# Patient Record
Sex: Male | Born: 1995 | Race: White | Hispanic: No | Marital: Single | Smoking: Former smoker
Health system: Southern US, Community
[De-identification: ages and names within clinical notes are randomized; demographics above are authoritative.]

---
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
## 2016-05-01 IMAGING — CR DG HAND COMPLETE 3+V*R*
3 series · 3 of 3 positions shown · non-contrast
Comparison: None.

CLINICAL DATA: Fall yesterday with right hand pain, initial
encounter

EXAM:
RIGHT HAND - COMPLETE 3+ VIEW

[PA]
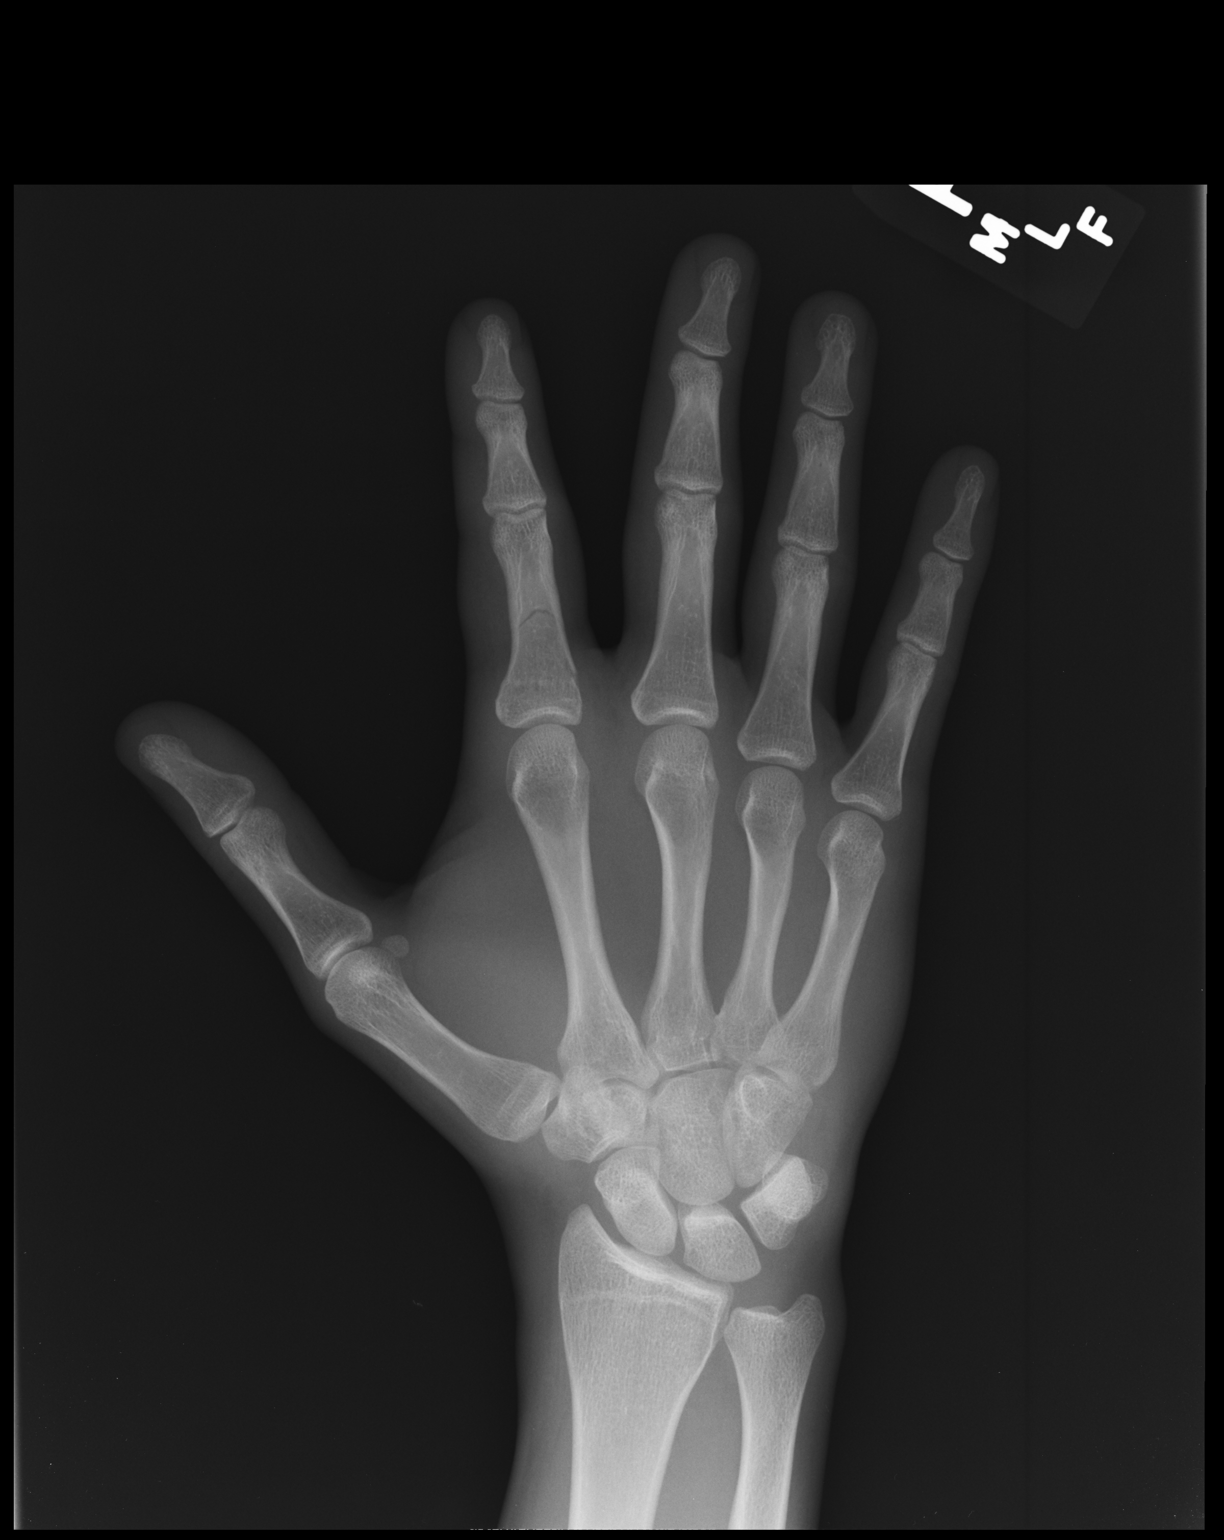

[lateral]
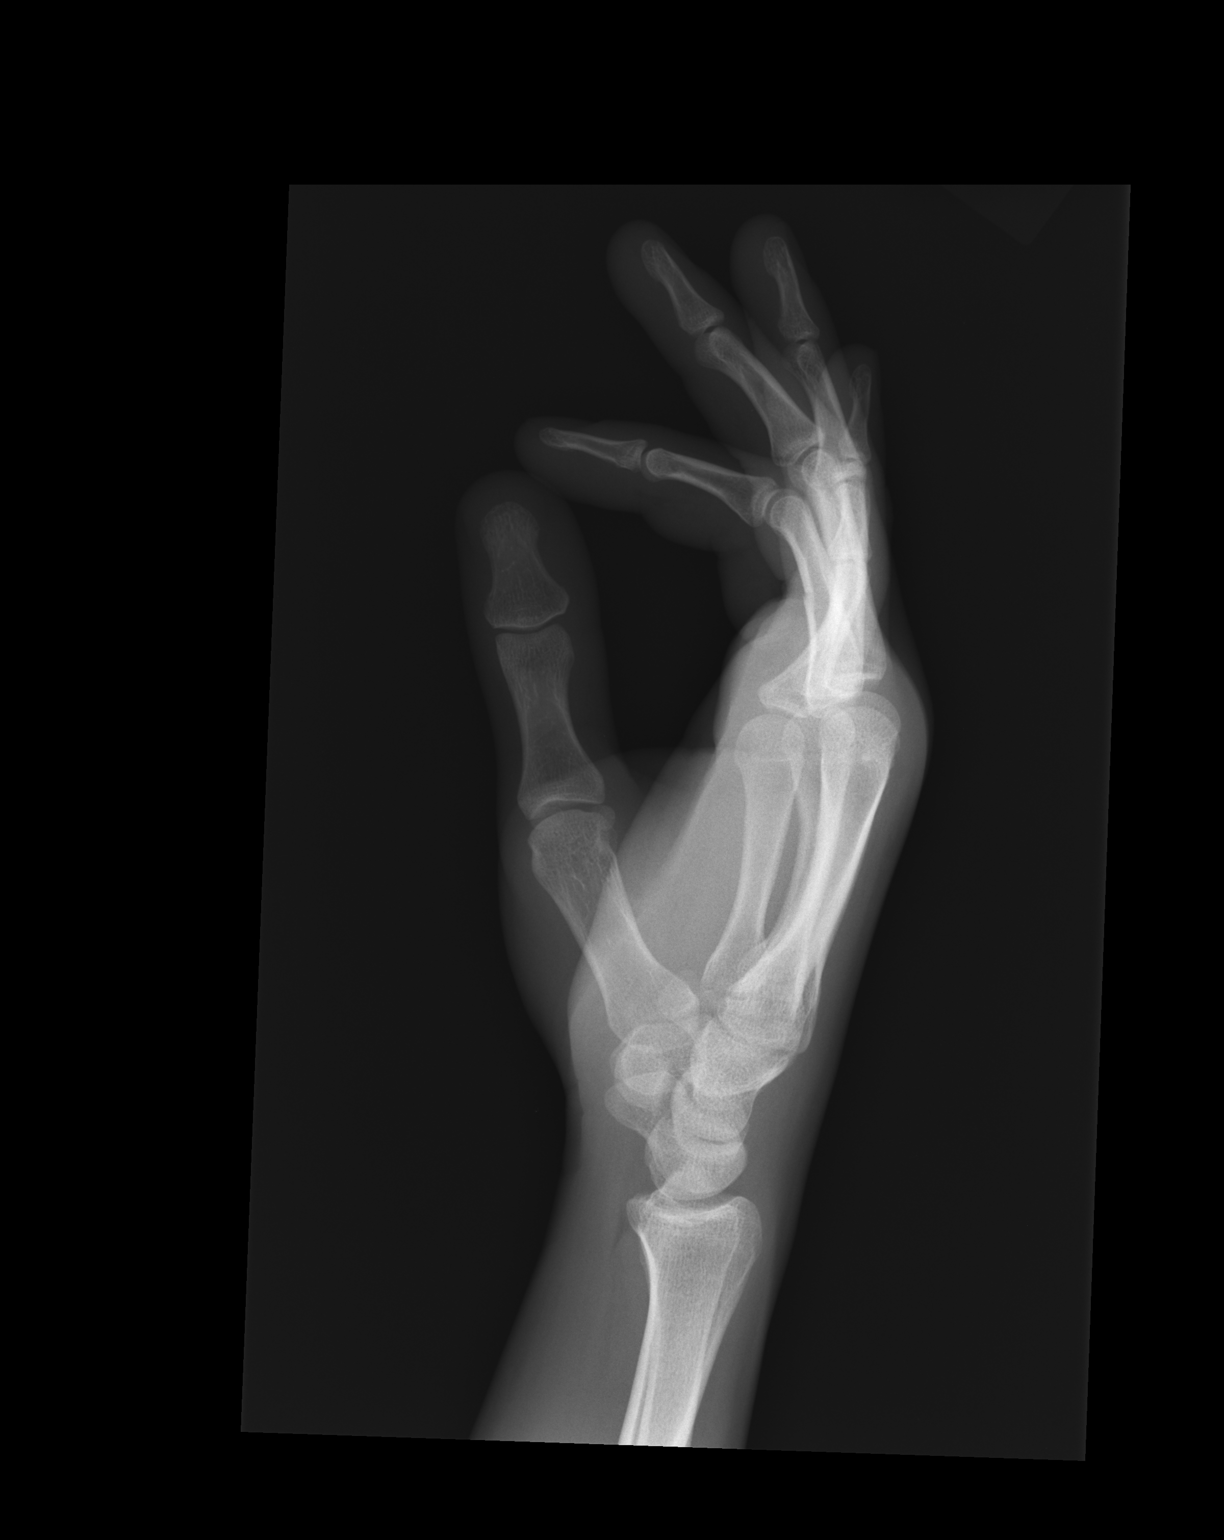

[pa obl]
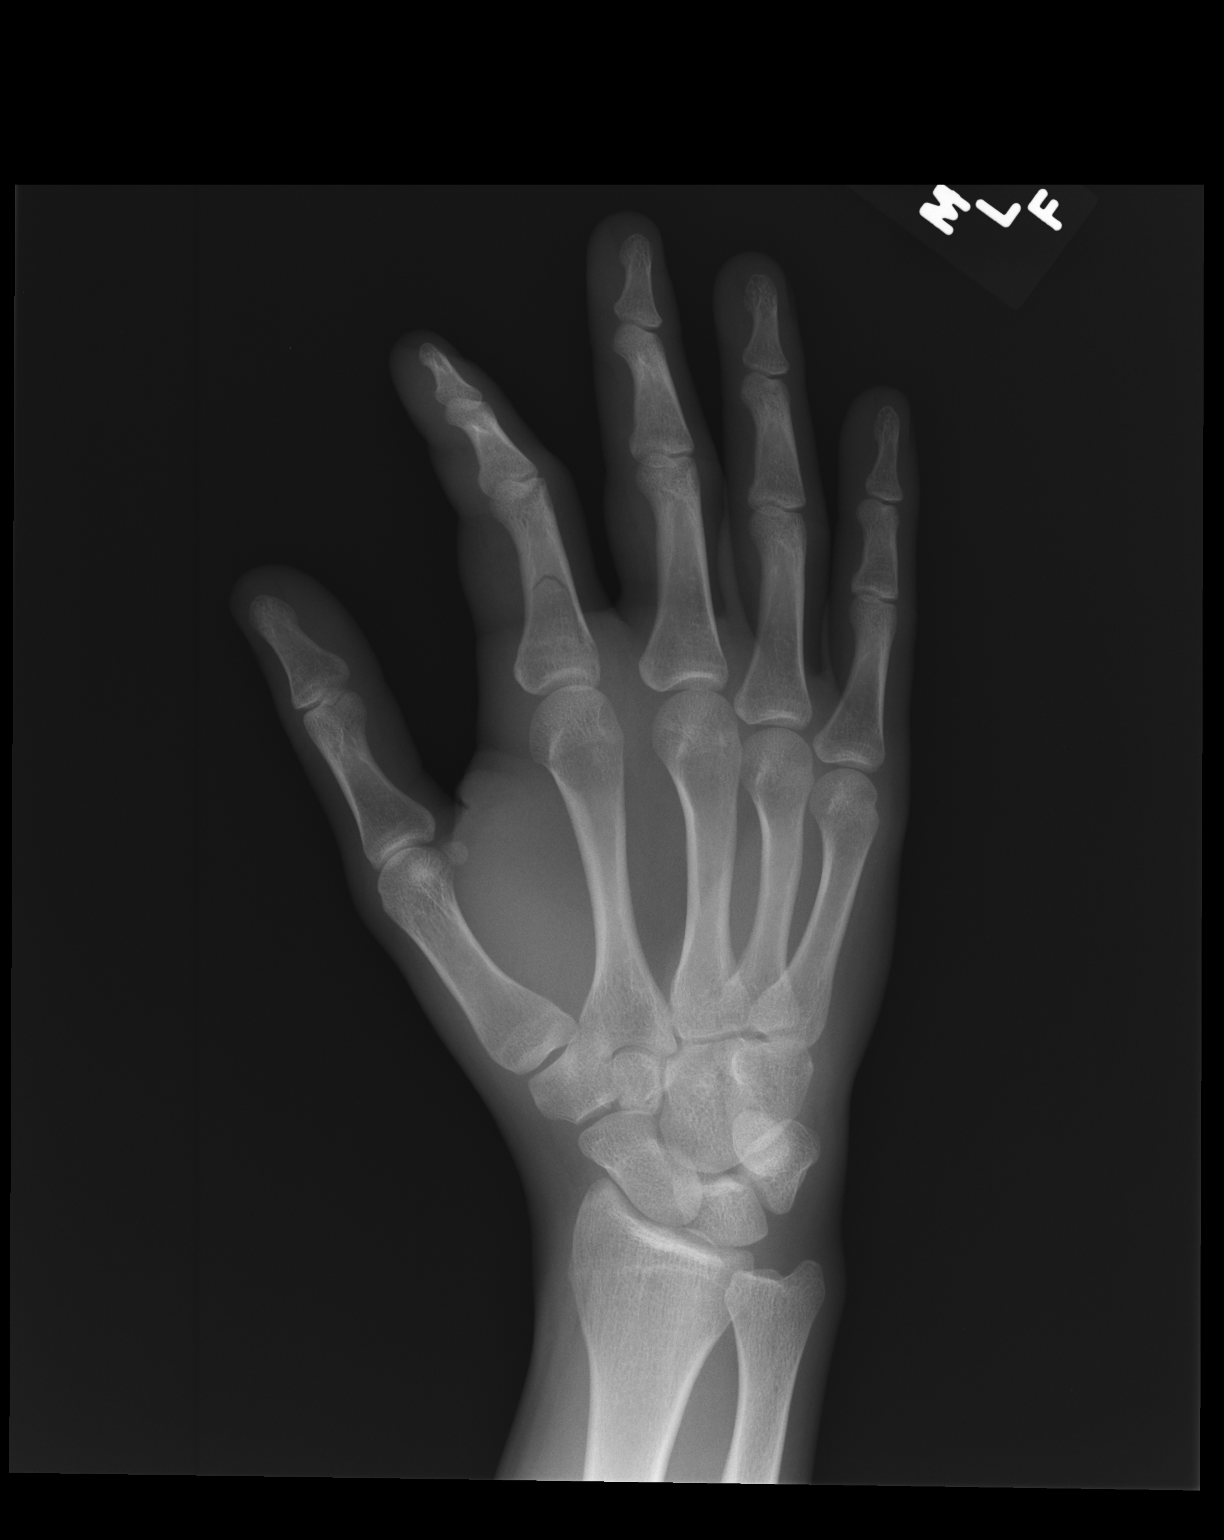

[3 of 3 positions shown; findings below may reference images not displayed]

FINDINGS: An undisplaced fracture of the second proximal phalanx is
identified. No significant angulation is seen. No other fractures
are noted.
IMPRESSION: Second proximal phalangeal fracture.

## 2016-05-18 ENCOUNTER — Ambulatory Visit (INDEPENDENT_AMBULATORY_CARE_PROVIDER_SITE_OTHER): Payer: Medicaid Other | Admitting: Student

## 2016-05-18 ENCOUNTER — Encounter: Payer: Self-pay | Admitting: Student

## 2016-05-18 VITALS — BP 106/64 | HR 63 | Temp 98.3°F | Ht 72.0 in | Wt 141.0 lb

## 2016-05-18 DIAGNOSIS — Z114 Encounter for screening for human immunodeficiency virus [HIV]: Secondary | ICD-10-CM

## 2016-05-18 DIAGNOSIS — Z8659 Personal history of other mental and behavioral disorders: Secondary | ICD-10-CM

## 2016-05-18 DIAGNOSIS — F0630 Mood disorder due to known physiological condition, unspecified: Secondary | ICD-10-CM | POA: Diagnosis not present

## 2016-05-18 DIAGNOSIS — Z0000 Encounter for general adult medical examination without abnormal findings: Secondary | ICD-10-CM | POA: Insufficient documentation

## 2016-05-18 NOTE — Assessment & Plan Note (Signed)
PHQ-9 11 with significant difficulty. Also reports some burst of energy occasionally which concerns me for underlying mania/hypomania. He also smokes THC and cigarette which makes it even challenging. So advised him to stop smoking marijuana and cigarette. Despite all these, he is working toward his goal. He has been accepted to collage. Will evaluate him again when he returns. Will be very cautious about SSRI on him

## 2016-05-18 NOTE — Patient Instructions (Signed)
It was great seeing you today! We have addressed the following issues today  1. Staying healthy: I strongly recommend you quit smoking cigarettes and marijuana 2. ADHD: will discuss about this when I receive your medical record from your previous provider    If we did any lab work today, and the results require attention, either me or my nurse will get in touch with you. If everything is normal, you will get a letter in mail. If you don't hear from us in two weeks, please give us a call. Otherwise, I look forward to talking with you again at our next visit. If you have any questions or concerns before then, please call the clinic at [phone].  Please bring all your medications to every doctors visit   Sign up for My Chart to have easy access to your labs results, and communication with your Primary care physician.    Please check-out at the front desk before leaving the clinic.   Take Care,

## 2016-05-18 NOTE — Assessment & Plan Note (Addendum)
Strongly advised him to quit smoking cigarette and THC. He agrees and understands the effect of this on his health. Will assess and remind him again when he returns in two weeks.  Will check HIV

## 2016-05-18 NOTE — Assessment & Plan Note (Signed)
Has been off medications for the last 4 years. Reports going to [place]. However, I couldn't not see any of his medical history in Epic. He hasn't seen doctor for over three years now. Signed release form today. Will address about this when I receive his medical records, probably in weeks to month.

## 2016-05-18 NOTE — Progress Notes (Signed)
Subjective:    Patient ID: Jack Nguyen, male    DOB: 15-Feb-1996, 20 y.o.   MRN: [id number]  CC: Establish care  HPI here to establish care. Has concern about the following issues:  -ADHD: off meds for 4 years due to side effects. Reports trying adderal, Vyvanse and some other medications in the past.   -Migraine headache: Reports occasional headache. Headache resolves with OTC meds (advil, tylenol). He has no headache now  -Depression: never on any meds. PHQ-9: 11. Endorses occasional depressed mood, some anhedonia, difficulty with concentration and guilt about not getting  A's like his sisters. Denies problem with sleep, appetite, fatigue. Denies  suicidal and homicidal ideation. Reports some brust of energy occasionally. Endorses smoking cigarettes and marijuana occasionally. Denies cocaine or other drugs.  Social:  -School: goes to [employer]. Graduating this year. Planning to go [employer] and study Business. He has been accepted.  -Work: Helps on golf course -Smoking: one cig a day. Planning to stop before it becomes. He says his girl friend and her family smoke. He says "I would like to stop now". Confidence level 6. "I don't have much issue" when asked why he is confident about this. He says THC is always there because of friends (peer pressure) -Alcohol use: denies -Drug: smokes THC almost every day. Can not explain why he is smoking.  Denies other drugs. Says he hang out with friends. Reports some peer pressure -Exercise: lift weight. Twice a week for the last two weeks. He has been going to gym more often before that -Sexual: active. One sexual partner in the last 6 months. No protection.   Review of Systems  Constitutional: Negative for fever and fatigue.  Eyes: Negative for photophobia and visual disturbance.  Respiratory: Negative for cough, chest tightness and shortness of breath.   Cardiovascular: Negative for chest pain, palpitations and leg  swelling.  Gastrointestinal: Negative for blood in stool.  Endocrine: Negative for cold intolerance and heat intolerance.  Genitourinary: Negative for dysuria, discharge, scrotal swelling, genital sores and testicular pain.  Musculoskeletal: Negative for joint swelling.  Skin: Negative for rash.  Neurological: Negative for headaches.  Hematological: Negative for adenopathy.  Psychiatric/Behavioral: Negative for suicidal ideas, hallucinations, behavioral problems, sleep disturbance and self-injury.   Objective:   Physical Exam Filed Vitals:   05/18/16 1544  BP: 106/64  Pulse: 63  Temp: 98.3 F (36.8 C)  TempSrc: Oral  Height: 6' (1.829 m)  Weight: 141 lb (63.957 kg)   GEN: pleasant, appears well, NAD HEENT: [place]/NT, PERRL, EOMI, TM normal, MMM CVS: RRR, normal s1 and s2, no murmurs, no edema RESP: no increased WOB, good air movement bilaterally, CTAB GI: +BS, soft, non-tender,non-distended,  GU: denies scrotal swelling or penile discharge or lesion MSK:no joint pain, swelling or tenderness NEURO: alert and oriented apporopriately, no gross defecits  PSYCH: appropriate mood and affect     Assessment & Plan:  Routine health maintenance Strongly advised him to quit smoking cigarette and THC. He agrees and understands the effect of this on his health. Will assess and remind him again when he returns in two weeks.  Will check HIV  History of ADHD Has been off medications for the last 4 years. Reports going to [place]. However, I couldn't not see any of his medical history in Epic. He hasn't seen doctor for over three years now. Signed release form today. Will address about this when I receive his medical records, probably in weeks to month.  Mood disorder in conditions classified elsewhere PHQ-9 11 with significant difficulty. Also reports some burst of energy occasionally which concerns me for underlying mania/hypomania. He also smokes THC and cigarette which makes it even  challenging. So advised him to stop smoking marijuana and cigarette. Despite all these, he is working toward his goal. He has been accepted to collage. Will evaluate him again when he returns. Will be very cautious about SSRI on him

## 2016-06-07 ENCOUNTER — Telehealth: Payer: Self-pay

## 2016-06-07 NOTE — Telephone Encounter (Signed)
Mrs. Jack Nguyen called in to see if her son's medical record request had been made. Ms. Jack Nguyen said she would get everything together in the am and have it ready for her to pick up before Jack Nguyen's 1:30pm appointment. I left medical record request form on the Medical Records desk along with a sticky note asking to please call Jack Nguyen when records are ready to be picked up.

## 2016-06-08 ENCOUNTER — Ambulatory Visit (INDEPENDENT_AMBULATORY_CARE_PROVIDER_SITE_OTHER): Payer: Medicaid Other | Admitting: Student

## 2016-06-08 ENCOUNTER — Encounter: Payer: Self-pay | Admitting: Student

## 2016-06-08 VITALS — BP 113/67 | HR 80 | Temp 98.6°F | Ht 72.0 in | Wt 136.0 lb

## 2016-06-08 DIAGNOSIS — F0630 Mood disorder due to known physiological condition, unspecified: Secondary | ICD-10-CM

## 2016-06-08 DIAGNOSIS — Z8659 Personal history of other mental and behavioral disorders: Secondary | ICD-10-CM | POA: Diagnosis not present

## 2016-06-08 DIAGNOSIS — F17200 Nicotine dependence, unspecified, uncomplicated: Secondary | ICD-10-CM

## 2016-06-08 DIAGNOSIS — Z720 Tobacco use: Secondary | ICD-10-CM | POA: Diagnosis present

## 2016-06-08 DIAGNOSIS — R5382 Chronic fatigue, unspecified: Secondary | ICD-10-CM | POA: Diagnosis not present

## 2016-06-08 LAB — CBC WITH DIFFERENTIAL/PLATELET
BASOS PCT: 0 %
Basophils Absolute: 0 cells/uL (ref 0–200)
EOS PCT: 0 %
Eosinophils Absolute: 0 cells/uL — ABNORMAL LOW (ref 15–500)
HCT: 46.8 % (ref 36.0–49.0)
Hemoglobin: 15.8 g/dL (ref 12.0–16.9)
LYMPHS PCT: 25 %
Lymphs Abs: 1875 cells/uL (ref 1200–5200)
MCH: 31.5 pg (ref 25.0–35.0)
MCHC: 33.8 g/dL (ref 31.0–36.0)
MCV: 93.2 fL (ref 78.0–98.0)
MPV: 10 fL (ref 7.5–12.5)
Monocytes Absolute: 600 cells/uL (ref 200–900)
Monocytes Relative: 8 %
NEUTROS ABS: 5025 {cells}/uL (ref 1800–8000)
Neutrophils Relative %: 67 %
PLATELETS: 262 10*3/uL (ref 140–400)
RBC: 5.02 MIL/uL (ref 4.10–5.70)
RDW: 13 % (ref 11.0–15.0)
WBC: 7.5 10*3/uL (ref 4.5–13.0)

## 2016-06-08 LAB — TSH: TSH: 0.71 mIU/L (ref 0.50–4.30)

## 2016-06-08 LAB — VITAMIN B12: Vitamin B-12: 863 pg/mL (ref 200–1100)

## 2016-06-08 MED ORDER — BUPROPION HCL ER (SR) 100 MG PO TB12
100.0000 mg | ORAL_TABLET | Freq: Two times a day (BID) | ORAL | Status: DC
Start: 2016-06-08 — End: 2024-05-22

## 2016-06-08 NOTE — Patient Instructions (Signed)
It was great seeing you today! We have addressed the following issues today  1. Inability to concentrate: I have sent a trial prescription for wellbuterin to your pharmacy. I will also recommend you stop using marijuana. I will order a referral to psychiatrist as well.  2. Smoking: congratulation on quitting smoking. The medication I prescribed you should also help with smoking.     If we did any lab work today, and the results require attention, either me or my nurse will get in touch with you. If everything is normal, you will get a letter in mail. If you don't hear from us in two weeks, please give us a call. Otherwise, I look forward to talking with you again at our next visit. If you have any questions or concerns before then, please call the clinic at [phone].  Please bring all your medications to every doctors visit   Sign up for My Chart to have easy access to your labs results, and communication with your Primary care physician.    Please check-out at the front desk before leaving the clinic.   Take Care,

## 2016-06-08 NOTE — Progress Notes (Signed)
   Subjective:    Patient ID: Jack Nguyen, male    DOB: 09-01-1996, 20 y.o.   MRN: [id number]  CC: Follow up on ?ADHD  HPI patient her for follow up on difficulty concentrating. He feels relieved and better because the school is closed. He says he spends his days relaxing at swimming pool and hanging out with his girlfriend. He occasionally goes to gym as well. He quit smoking. He says hasn't moked for the last 10 days. However, he continues to smoke marijuana with his friends. He says his mood is good. Denies SI or HI. Denies insomnia, fatigue, appetite issue, unintentional and purposeless motions or burst of energy. He says he is not sure about his concentration problem as school is closed and he doesn't have anything to concentrate on. Denies cold or heat intolerance.  Patient's mother report problem with appetite and memory as well. She says he hangout with his girl friend who doesn't eat a lot. Mother says non of the stimulant's worked for him in the past. She says they have not tried Strattera as the medicine was very expensive at that time. She says "it didn't worth to spend $500 on a medicine they are unsure if it helps" They brought paper copies of his medical record from [place] today.   Review of Systems per HPI Objective:   Physical Exam Filed Vitals:   06/08/16 1347  BP: 113/67  Pulse: 80  Temp: 98.6 F (37 C)  TempSrc: Oral  Height: 6' (1.829 m)  Weight: 136 lb (61.689 kg)    GEN: appears well, NAD Neck: supple, no LAD CVS: RRR, normal s1 and s2, no murmurs, no edema RESP: no increased work of breathing, good air movement bilaterally, no crackles or wheeze GI: soft, non-tender,non-distended, +BS NEURO: A&O x3, no gross defecits  PSYCH: mood "good" appropriate affect     Assessment & Plan:  History of ADHD Unclear about this diagnosis. The fact that they have tried multiple stimulant medications and none worked makes it even less likely. He doesn't reports  hyperactivity or impulsivity.  His problem doesn't appear to have significant impact on his social and functional status to my understanding. He continues to maintain good relation with his girl friend and his other friends. He is already accepted to college. Congratulated him on quitting smoking. Others on differential diagnosis is depression also he doesn't meet the criteria. I am concerned about his marijuana use and warned him about the impact of this on his health and his care. I have discussed about trying Bupropion, which could potentially help with mood issue (if there is one), smoking and ?ADHD/inattention. He has no history of seizure. I will check his TSH, Vit B12 level, Vit D level and CBC today. I have also ordered ambulatory psychiatric referral.  I will review his papers from [facility] as well.

## 2016-06-09 ENCOUNTER — Encounter: Payer: Self-pay | Admitting: Student

## 2016-06-09 LAB — VITAMIN D 25 HYDROXY (VIT D DEFICIENCY, FRACTURES): Vit D, 25-Hydroxy: 30 ng/mL (ref 30–100)

## 2016-06-09 NOTE — Assessment & Plan Note (Signed)
Unclear about this diagnosis. The fact that they have tried multiple stimulant medications and none worked makes it even less likely. He doesn't reports hyperactivity or impulsivity.  His problem doesn't appear to have significant impact on his social and functional status to my understanding. He continues to maintain good relation with his girl friend and his other friends. He is already accepted to college. Congratulated him on quitting smoking. Others on differential diagnosis is depression also he doesn't meet the criteria. I am concerned about his marijuana use and warned him about the impact of this on his health and his care. I have discussed about trying Bupropion, which could potentially help with mood issue (if there is one), smoking and ?ADHD/inattention. He has no history of seizure. I will check his TSH, Vit B12 level, Vit D level and CBC today. I have also ordered ambulatory psychiatric referral.  I will review his papers from [place] as well.

## 2016-12-20 ENCOUNTER — Encounter (HOSPITAL_COMMUNITY): Payer: Self-pay | Admitting: Family Medicine

## 2016-12-20 ENCOUNTER — Ambulatory Visit (HOSPITAL_COMMUNITY)
Admission: EM | Admit: 2016-12-20 | Discharge: 2016-12-20 | Disposition: A | Discharge status: Home / Self Care | Payer: Medicaid Other | Attending: Family Medicine | Admitting: Family Medicine

## 2016-12-20 DIAGNOSIS — H9201 Otalgia, right ear: Secondary | ICD-10-CM

## 2016-12-20 MED ORDER — IPRATROPIUM BROMIDE 0.06 % NA SOLN: 2.0000 | Freq: Four times a day (QID) | NASAL | 1 refills | Status: DC

## 2016-12-20 MED ORDER — PREDNISONE 50 MG PO TABS: ORAL_TABLET | ORAL | 0 refills | Status: DC

## 2016-12-20 NOTE — ED Triage Notes (Signed)
Pt here for right ear pain and sinus congestion x 3 weeks.

## 2016-12-20 NOTE — ED Provider Notes (Signed)
[facility]    CSN: [id number] Arrival date & time: 12/20/16  1533     History   Chief Complaint Chief Complaint  Patient presents with  . Otalgia  . Facial Pain    HPI Jack Nguyen is a 20 y.o. male.   The history is provided by the patient.  Otalgia  Location:  Right Behind ear:  No abnormality Quality:  Pressure Severity:  Moderate Onset quality:  Gradual Duration:  3 weeks Progression:  Waxing and waning Chronicity:  New Context: elevation change and recent URI   Associated symptoms: congestion and rhinorrhea   Associated symptoms: no ear discharge and no fever     History reviewed. No pertinent past medical history.  Patient Active Problem List   Diagnosis Date Noted  . History of ADHD 05/18/2016  . Mood disorder in conditions classified elsewhere 05/18/2016  . Routine health maintenance 05/18/2016    History reviewed. No pertinent surgical history.     Home Medications    Prior to Admission medications   Medication Sig Start Date End Date Taking? Authorizing Provider  buPROPion (WELLBUTRIN SR) 100 MG 12 hr tablet Take 1 tablet (100 mg total) by mouth 2 (two) times daily. 06/08/16   Almon Herculesaye T Gonfa, MD    Family History History reviewed. No pertinent family history.  Social History Social History  Substance Use Topics  . Smoking status: Former Smoker    Types: Cigarettes    Quit date: 05/25/2016  . Smokeless tobacco: Never Used  . Alcohol use No     Allergies   Patient has no known allergies.   Review of Systems Review of Systems  Constitutional: Negative for fever.  HENT: Positive for congestion, ear pain and rhinorrhea. Negative for ear discharge.   All other systems reviewed and are negative.    Physical Exam Triage Vital Signs ED Triage Vitals  Enc Vitals Group     BP 12/20/16 1704 115/64     Pulse Rate 12/20/16 1704 85     Resp 12/20/16 1704 18     Temp 12/20/16 1704 97.9 F (36.6 C)     Temp src --    SpO2 12/20/16 1704 98 %     Weight --      Height --      Head Circumference --      Peak Flow --      Pain Score 12/20/16 1705 1     Pain Loc --      Pain Edu? --      Excl. in GC? --    No data found.   Updated Vital Signs BP 115/64   Pulse 85   Temp 97.9 F (36.6 C)   Resp 18   SpO2 98%   Visual Acuity Right Eye Distance:   Left Eye Distance:   Bilateral Distance:    Right Eye Near:   Left Eye Near:    Bilateral Near:     Physical Exam  Constitutional: He is oriented to person, place, and time. He appears well-developed and well-nourished.  HENT:  Right Ear: External ear and ear canal normal. Tympanic membrane is injected, erythematous and retracted. Tympanic membrane mobility is abnormal.  Left Ear: Hearing, tympanic membrane, external ear and ear canal normal.  Eyes: EOM are normal. Pupils are equal, round, and reactive to light.  Neck: Normal range of motion. Neck supple.  Lymphadenopathy:    He has no cervical adenopathy.  Neurological: He is alert and oriented to  person, place, and time.  Skin: Skin is warm and dry.  Nursing note and vitals reviewed.    UC Treatments / Results  Labs (all labs ordered are listed, but only abnormal results are displayed) Labs Reviewed - No data to display  EKG  EKG Interpretation None       Radiology No results found.  Procedures Procedures (including critical care time)  Medications Ordered in UC Medications - No data to display   Initial Impression / Assessment and Plan / UC Course  I have reviewed the triage vital signs and the nursing notes.  Pertinent labs & imaging results that were available during my care of the patient were reviewed by me and considered in my medical decision making (see chart for details).  Clinical Course       Final Clinical Impressions(s) / UC Diagnoses   Final diagnoses:  None    New Prescriptions New Prescriptions   No medications on file     Linna HoffJames D Keino Placencia,  MD 12/20/16 1805

## 2017-02-26 DIAGNOSIS — F900 Attention-deficit hyperactivity disorder, predominantly inattentive type: Secondary | ICD-10-CM | POA: Diagnosis not present

## 2017-12-31 DIAGNOSIS — K529 Noninfective gastroenteritis and colitis, unspecified: Secondary | ICD-10-CM | POA: Diagnosis not present

## 2018-12-29 DIAGNOSIS — F900 Attention-deficit hyperactivity disorder, predominantly inattentive type: Secondary | ICD-10-CM | POA: Diagnosis not present

## 2019-04-20 DIAGNOSIS — F900 Attention-deficit hyperactivity disorder, predominantly inattentive type: Secondary | ICD-10-CM | POA: Diagnosis not present

## 2019-06-03 DIAGNOSIS — K529 Noninfective gastroenteritis and colitis, unspecified: Secondary | ICD-10-CM | POA: Diagnosis not present

## 2019-06-03 DIAGNOSIS — R109 Unspecified abdominal pain: Secondary | ICD-10-CM | POA: Diagnosis not present

## 2019-06-08 DIAGNOSIS — K9049 Malabsorption due to intolerance, not elsewhere classified: Secondary | ICD-10-CM | POA: Diagnosis not present

## 2019-06-08 DIAGNOSIS — K589 Irritable bowel syndrome without diarrhea: Secondary | ICD-10-CM | POA: Diagnosis not present

## 2020-07-13 DIAGNOSIS — F900 Attention-deficit hyperactivity disorder, predominantly inattentive type: Secondary | ICD-10-CM | POA: Diagnosis not present

## 2020-07-22 DIAGNOSIS — M542 Cervicalgia: Secondary | ICD-10-CM | POA: Diagnosis not present

## 2020-07-22 DIAGNOSIS — M7541 Impingement syndrome of right shoulder: Secondary | ICD-10-CM | POA: Diagnosis not present

## 2020-12-30 DIAGNOSIS — F900 Attention-deficit hyperactivity disorder, predominantly inattentive type: Secondary | ICD-10-CM | POA: Diagnosis not present

## 2021-01-06 DIAGNOSIS — F1220 Cannabis dependence, uncomplicated: Secondary | ICD-10-CM | POA: Diagnosis not present

## 2021-01-12 DIAGNOSIS — F1220 Cannabis dependence, uncomplicated: Secondary | ICD-10-CM | POA: Diagnosis not present

## 2021-01-17 DIAGNOSIS — F1220 Cannabis dependence, uncomplicated: Secondary | ICD-10-CM | POA: Diagnosis not present

## 2021-01-24 DIAGNOSIS — F1220 Cannabis dependence, uncomplicated: Secondary | ICD-10-CM | POA: Diagnosis not present

## 2021-01-31 DIAGNOSIS — F1220 Cannabis dependence, uncomplicated: Secondary | ICD-10-CM | POA: Diagnosis not present

## 2021-02-03 DIAGNOSIS — M25512 Pain in left shoulder: Secondary | ICD-10-CM | POA: Diagnosis not present

## 2021-02-07 DIAGNOSIS — F1220 Cannabis dependence, uncomplicated: Secondary | ICD-10-CM | POA: Diagnosis not present

## 2021-02-09 DIAGNOSIS — F1220 Cannabis dependence, uncomplicated: Secondary | ICD-10-CM | POA: Diagnosis not present

## 2021-02-14 DIAGNOSIS — F1220 Cannabis dependence, uncomplicated: Secondary | ICD-10-CM | POA: Diagnosis not present

## 2021-02-15 DIAGNOSIS — F1920 Other psychoactive substance dependence, uncomplicated: Secondary | ICD-10-CM | POA: Diagnosis not present

## 2021-03-15 DIAGNOSIS — R0781 Pleurodynia: Secondary | ICD-10-CM | POA: Diagnosis not present

## 2023-02-21 NOTE — Progress Notes (Signed)
    Jack Nguyen D.[place] Phone: [phone]   Assessment and Plan:     1. Chronic bilateral low back pain without sciatica 2. Chronic left SI joint pain -Chronic with exacerbation, initial sports medicine visit - Most consistent with muscular dysfunction in lumbosacral junction, primarily left-sided, without red flag symptoms - X-rays obtained in clinic.  My interpretation: No acute fracture, dislocation, vertebral collapse.  Will await official radiology review - Start HEP and physical therapy for low back, pelvic dysfunction, SI joints - Start meloxicam 15 mg daily x2 weeks.  If still having pain after 2 weeks, complete 3rd-week of meloxicam. May use remaining meloxicam as needed once daily for pain control.  Do not to use additional NSAIDs while taking meloxicam.  May use Tylenol [phone] mg 2 to 3 times a day for breakthrough pain.  Other orders - meloxicam (MOBIC) 15 MG tablet; Take 1 tablet (15 mg total) by mouth daily.    Pertinent previous records reviewed include none   Follow Up: 4 weeks for reevaluation.  Could consider OMT   Subjective:   I, Jack Nguyen, am serving as a Education administrator for Doctor Glennon Mac  Chief Complaint: hip and tailbone pain   HPI:  02/22/2023 Patient is a 27 year old male complaining of hip and tailbone pain. Patient states that he has constant tenderness and stiffness, can pop it over and over again when he moves his pelvis forward is painful , pain start top of the hip and spine and moves to the inner hip bone / bottom of tailbone . The only thing that helps is rest, stretching doesn't help , the best he feels is when he wake up and the worst is at night time, no MOI, been going on for roughly 2 years , no meds for the pain , no numbness or tingling, a scrapping feeling   Relevant Historical Information: None pertinent  Additional pertinent review of systems  negative.   Current Outpatient Medications:    buPROPion (WELLBUTRIN SR) 100 MG 12 hr tablet, Take 1 tablet (100 mg total) by mouth 2 (two) times daily., Disp: 60 tablet, Rfl: 0   ipratropium (ATROVENT) 0.06 % nasal spray, Place 2 sprays into both nostrils 4 (four) times daily., Disp: 15 mL, Rfl: 1   meloxicam (MOBIC) 15 MG tablet, Take 1 tablet (15 mg total) by mouth daily., Disp: 30 tablet, Rfl: 0   Objective:     Vitals:   02/22/23 1447  BP: 118/80  Pulse: 75  SpO2: 99%  Weight: 163 lb (73.9 kg)  Height: 6' (1.829 m)      Body mass index is 22.11 kg/m.    Physical Exam:    Gen: Appears well, nad, nontoxic and pleasant Psych: Alert and oriented, appropriate mood and affect Neuro: sensation intact, strength is 5/5 in upper and lower extremities, muscle tone wnl Skin: no susupicious lesions or rashes  Back - Normal skin, Spine with normal alignment and no deformity.   No tenderness to vertebral process palpation.   Paraspinous muscles are not tender and without spasm NTTP gluteal musculature Straight leg raise negative Trendelenberg negative Piriformis Test negative  No pain with lumbar flexion or extension Negative FABER and FADIR bilaterally TTP left sacral base with positive sphinx  Electronically signed by:  Jack Nguyen D.[facility] 3:44 PM 02/22/23

## 2023-02-22 ENCOUNTER — Ambulatory Visit: Payer: 59 | Admitting: Sports Medicine

## 2023-02-22 ENCOUNTER — Ambulatory Visit (INDEPENDENT_AMBULATORY_CARE_PROVIDER_SITE_OTHER): Payer: 59

## 2023-02-22 VITALS — BP 118/80 | HR 75 | Ht 72.0 in | Wt 163.0 lb

## 2023-02-22 DIAGNOSIS — M533 Sacrococcygeal disorders, not elsewhere classified: Secondary | ICD-10-CM | POA: Diagnosis not present

## 2023-02-22 DIAGNOSIS — G8929 Other chronic pain: Secondary | ICD-10-CM | POA: Diagnosis not present

## 2023-02-22 DIAGNOSIS — R102 Pelvic and perineal pain: Secondary | ICD-10-CM | POA: Diagnosis not present

## 2023-02-22 DIAGNOSIS — M545 Low back pain, unspecified: Secondary | ICD-10-CM

## 2023-02-22 MED ORDER — MELOXICAM 15 MG PO TABS: 15.0000 mg | ORAL_TABLET | Freq: Every day | ORAL | 0 refills | Status: DC

## 2023-02-22 NOTE — Patient Instructions (Addendum)
Good to see you - Start meloxicam 15 mg daily x2 weeks.  If still having pain after 2 weeks, complete 3rd-week of meloxicam. May use remaining meloxicam as needed once daily for pain control.  Do not to use additional NSAIDs while taking meloxicam.  May use Tylenol [phone] mg 2 to 3 times a day for breakthrough pain. Pt referral  HEP low back  4 week follow up

## 2023-03-13 ENCOUNTER — Ambulatory Visit: Payer: 59 | Attending: Sports Medicine

## 2023-03-13 ENCOUNTER — Other Ambulatory Visit: Payer: Self-pay

## 2023-03-13 DIAGNOSIS — G8929 Other chronic pain: Secondary | ICD-10-CM | POA: Diagnosis not present

## 2023-03-13 DIAGNOSIS — M545 Low back pain, unspecified: Secondary | ICD-10-CM | POA: Diagnosis not present

## 2023-03-13 DIAGNOSIS — M5459 Other low back pain: Secondary | ICD-10-CM | POA: Diagnosis not present

## 2023-03-13 NOTE — Therapy (Signed)
OUTPATIENT PHYSICAL THERAPY THORACOLUMBAR EVALUATION   Patient Name: Jack Nguyen MRN: [id number] DOB:02-14-1996, 27 y.o., male Today's Date: 03/14/2023  END OF SESSION:  PT End of Session - 03/13/23 1334     Visit Number 1    Number of Visits 7    Date for PT Re-Evaluation 05/03/23    Authorization Type [employer] [facility]    PT Start Time 1330    PT Stop Time 1415    PT Time Calculation (min) 45 min    Activity Tolerance Patient tolerated treatment well    Behavior During Therapy [facility] for tasks assessed/performed             History reviewed. No pertinent past medical history. History reviewed. No pertinent surgical history. Patient Active Problem List   Diagnosis Date Noted   History of ADHD 05/18/2016   Mood disorder in conditions classified elsewhere 05/18/2016   Routine health maintenance 05/18/2016    PCP: Kathrene Alu, MD   REFERRING PROVIDER: Glennon Mac, DO   REFERRING DIAG: M54.50 (ICD-10-CM) - Low back pain, unspecified back pain laterality, unspecified chronicity, unspecified whether sciatica present   Rationale for Evaluation and Treatment: Rehabilitation  THERAPY DIAG:  Other low back pain  ONSET DATE: 2 years  SUBJECTIVE:                                                                                                                                                                                           SUBJECTIVE STATEMENT: 2 year Hx of L LBP pain which is worse with activity; ie weight lifting, golf, basketball. Feels best in the AM and increases throughout the the day. On days with low activity, the increase in pain is none to minimal. Pt can "pop and scrap" the L low back, but it doesn't help to decrease the pain. Pt denies a precipitating incident and denies radicular pain.   PERTINENT HISTORY:  See PMH above  PAIN:  Are you having pain? Yes: NPRS scale: 1/10 Pain location: L LB Pain description: tender and  sore Aggravating factors: increased activity Relieving factors: Rest, sitting Pain range: 1-7/10   PRECAUTIONS: None  WEIGHT BEARING RESTRICTIONS: No  FALLS:  Has patient fallen in last 6 months? No  LIVING ENVIRONMENT: Lives with: lives with their family Lives in: House/apartment No issue with accessing  OCCUPATION: School - Accounting  PLOF: Independent  PATIENT GOALS: Less pain with activity  NEXT MD VISIT: Next month  OBJECTIVE:   DIAGNOSTIC FINDINGS:  02/22/23 EXAM: DG SACRUM AND COCCYX - 2+ VIEW FINDINGS: There is no evidence of fracture or other focal bone lesions.  EXAM: DG PELVIS - 1-2 VIEW FINDINGS: There is no evidence of pelvic fracture or diastasis. No pelvic bone lesions are seen.  EXAM:DG LUMBAR SPINE - 2-3 VIEW FINDINGS: There is no evidence of lumbar spine fracture. Alignment is normal. Intervertebral disc spaces are maintained.   IMPRESSION: No bony abnormality identified to explain the patient's symptoms.   PATIENT SURVEYS:  FOTO: 94%   SCREENING FOR RED FLAGS: Bowel or bladder incontinence: No Spinal tumors: No Cauda equina syndrome: No Compression fracture: No  COGNITION: Overall cognitive status: Within functional limits for tasks assessed     SENSATION: WFL  MUSCLE LENGTH: Hamstrings: Right 60 deg; Left 55 deg Thomas test: Right WNLs deg; Left WNLs deg  POSTURE: No Significant postural limitations and l shoulder higher than R  PALPATION: TTP L PSIS area, medially th most  LUMBAR ROM:   AROM eval  Flexion Full  Extension Full, pain L  LBP  Right lateral flexion Full  Left lateral flexion Full, L LBP  Right rotation Full  Left rotation Full   (Blank rows = not tested)  LOWER EXTREMITY ROM:     Passive  Right eval Left eval  Hip flexion    Hip extension    Hip abduction    Hip adduction    Hip internal rotation 50 40  Hip external rotation    Knee flexion    Knee extension    Ankle dorsiflexion     Ankle plantarflexion    Ankle inversion    Ankle eversion     (Blank rows = not tested)  LOWER EXTREMITY MMT:   5/5 bilat  MMT Right eval Left eval  Hip flexion    Hip extension    Hip abduction    Hip adduction    Hip internal rotation    Hip external rotation    Knee flexion    Knee extension    Ankle dorsiflexion    Ankle plantarflexion    Ankle inversion    Ankle eversion     (Blank rows = not tested)  LUMBAR SPECIAL TESTS:  Straight leg raise test: Negative, Slump test: Negative, and SI Compression/distraction test: Negative  FUNCTIONAL TESTS:  NT  GAIT: Distance walked: 200' Assistive device utilized: None Level of assistance: Complete Independence Comments: WNLs  TODAY'S TREATMENT:                                                                                                                               [facility] Adult PT Treatment:                                                DATE: 03/13/23 Therapeutic Exercise: Developed, instructed in, and pt completed therex as noted in HEP   PATIENT EDUCATION:  Education details: Eval findings, POC, HEP  Person  educated: Patient Education method: Explanation, Demonstration, Tactile cues, Verbal cues, and Handouts Education comprehension: verbalized understanding, returned demonstration, verbal cues required, and tactile cues required  HOME EXERCISE PROGRAM: Access Code: [email or web address] URL: [email or web address] Date: 03/14/2023 Prepared by: Gar Ponto  Exercises - Supine Bridge  - 1-2 x daily - 7 x weekly - 2 sets - 10 reps - 10 hold - Single Leg Bridge  - 1-2 x daily - 7 x weekly - 2 sets - 10 reps - 5 hold - Supine Piriformis Stretch  - 2 x daily - 7 x weekly - 1 sets - 3 reps - 20 hold - Seated Hamstring Stretch  - 2 x daily - 7 x weekly - 1 sets - 3 reps  ASSESSMENT:  CLINICAL IMPRESSION: Patient is a 27 y.o. male who was seen today for physical therapy evaluation and treatment for M54.50  (ICD-10-CM) - Low back pain, unspecified back pain laterality, unspecified chronicity, unspecified whether sciatica present. Pt  presents with L LBP which is provoked with trunk ext and L side bending, and palpation in the area of the L PSIS. L hip IR and hamstring flexibility are decreased in compaison to the R. Generally, the level of pain in this area is low, and is aggravated by activities of significant demand. Pt will benefit from skilled PT to address impairments for improved function and QOL.   OBJECTIVE IMPAIRMENTS: decreased activity tolerance and decreased ROM.   ACTIVITY LIMITATIONS: Recreational pursuits  PARTICIPATION LIMITATIONS:  Recreational pursuits  PERSONAL FACTORS: Past/current experiences and Time since onset of injury/illness/exacerbation are also affecting patient's functional outcome.   REHAB POTENTIAL: Excellent  CLINICAL DECISION MAKING: Stable/uncomplicated  EVALUATION COMPLEXITY: Low   GOALS:   SHORT TERM GOALS= LTGS   LONG TERM GOALS: Target date: 05/03/23  Pt will be Ind in a final HEP to maintain achieved LOF  Baseline: initiated Goal status: INITIAL  2.  Increase L hip IR and L hamstring flexibility equal to the R LE Baseline: see flow sheets Goal status: INITIAL  3.  Pt will report a decrease in L LBP to 0-3 for improved ability to participate in recreational pursuits and QOL Baseline: 1-7/10 Goal status: INITIAL  PLAN:  PT FREQUENCY: 1x/week  PT DURATION: 6 weeks  PLANNED INTERVENTIONS: Therapeutic exercises, Therapeutic activity, Gait training, Patient/Family education, Self Care, Joint mobilization, Dry Needling, Electrical stimulation, Spinal mobilization, Cryotherapy, Moist heat, Taping, Ionotophoresis 4mg /ml Dexamethasone, Manual therapy, and Re-evaluation.  PLAN FOR NEXT SESSION: Review FOTO; assess response to HEP; progress therex as indicated; use of modalities, manual therapy; and TPDN as indicated.  Akylah Hascall MS,  PT 03/14/23 1:25 PM

## 2023-03-20 ENCOUNTER — Ambulatory Visit: Payer: 59 | Admitting: Sports Medicine

## 2023-03-27 ENCOUNTER — Telehealth: Payer: Self-pay

## 2023-03-27 ENCOUNTER — Ambulatory Visit: Payer: 59 | Attending: Sports Medicine

## 2023-03-27 DIAGNOSIS — M5459 Other low back pain: Secondary | ICD-10-CM | POA: Insufficient documentation

## 2023-03-27 NOTE — Telephone Encounter (Signed)
Spoke with pt and he reports being confused about his appt time. Pt reports he is scheduled for an appt next week.

## 2023-04-03 ENCOUNTER — Ambulatory Visit: Payer: 59

## 2023-04-09 NOTE — Therapy (Addendum)
OUTPATIENT PHYSICAL THERAPY TREATMENT NOTE/Discharge   Patient Name: Jack Nguyen MRN: [id number] DOB:1996-06-07, 27 y.o., male 46 Date: 04/10/2023  PCP: Lennox Solders, MD  REFERRING PROVIDER: Richardean Sale, DO   END OF SESSION:   PT End of Session - 04/10/23 1443     Visit Number 2    Number of Visits 7    Date for PT Re-Evaluation 05/03/23    Authorization Type [employer] [facility]    PT Start Time 1330    PT Stop Time 1415    PT Time Calculation (min) 45 min    Activity Tolerance Patient tolerated treatment well    Behavior During Therapy [facility] for tasks assessed/performed             History reviewed. No pertinent past medical history. History reviewed. No pertinent surgical history. Patient Active Problem List   Diagnosis Date Noted   History of ADHD 05/18/2016   Mood disorder in conditions classified elsewhere 05/18/2016   Routine health maintenance 05/18/2016    REFERRING DIAG: M54.50 (ICD-10-CM) - Low back pain, unspecified back pain laterality, unspecified chronicity, unspecified whether sciatica present   THERAPY DIAG:  Other low back pain  Rationale for Evaluation and Treatment Rehabilitation  ONSET DATE: 2 years   SUBJECTIVE:                                                                                                                                                                                            SUBJECTIVE STATEMENT: Pt reports his L low back pain has not improved. It is till aggravated by increased activiy, golf,  frisbee golf, and running. Pt will experience L low back/SI joint pain after, but not during an activity which is resolved the next day.  PERTINENT HISTORY:  See PMH above   PAIN:  Are you having pain? Yes: NPRS scale: 1/10 Pain location: L LB Pain description: tender and sore Aggravating factors: increased activity Relieving factors: Rest, sitting Pain range: 1-7/10 Generally 5/10 after activity      PRECAUTIONS: None   WEIGHT BEARING RESTRICTIONS: No   FALLS:  Has patient fallen in last 6 months? No   LIVING ENVIRONMENT: Lives with: lives with their family Lives in: House/apartment No issue with accessing   OCCUPATION: School - Accounting   PLOF: Independent   PATIENT GOALS: Less pain with activity   NEXT MD VISIT: Next month   OBJECTIVE: (objective measures completed at initial evaluation unless otherwise dated)   DIAGNOSTIC FINDINGS:  02/22/23 EXAM: DG SACRUM AND COCCYX - 2+ VIEW FINDINGS: There is no evidence of fracture or other focal bone lesions.  EXAM: DG PELVIS - 1-2 VIEW FINDINGS: There is no evidence of pelvic fracture or diastasis. No pelvic bone lesions are seen.   EXAM:DG LUMBAR SPINE - 2-3 VIEW FINDINGS: There is no evidence of lumbar spine fracture. Alignment is normal. Intervertebral disc spaces are maintained.   IMPRESSION: No bony abnormality identified to explain the patient's symptoms.   PATIENT SURVEYS:  FOTO: 94%    SCREENING FOR RED FLAGS: Bowel or bladder incontinence: No Spinal tumors: No Cauda equina syndrome: No Compression fracture: No   COGNITION: Overall cognitive status: Within functional limits for tasks assessed                          SENSATION: WFL   MUSCLE LENGTH: Hamstrings: Right 60 deg; Left 55 deg Thomas test: Right WNLs deg; Left WNLs deg   POSTURE: No Significant postural limitations and l shoulder higher than R   PALPATION: TTP L PSIS area, medially th most   LUMBAR ROM:    AROM eval  Flexion Full  Extension Full, pain L  LBP  Right lateral flexion Full  Left lateral flexion Full, L LBP  Right rotation Full  Left rotation Full   (Blank rows = not tested)   LOWER EXTREMITY ROM:      Passive  Right eval Left eval  Hip flexion      Hip extension      Hip abduction      Hip adduction      Hip internal rotation 50 40  Hip external rotation      Knee flexion      Knee extension       Ankle dorsiflexion      Ankle plantarflexion      Ankle inversion      Ankle eversion       (Blank rows = not tested)   LOWER EXTREMITY MMT:   5/5 bilat  MMT Right eval Left eval  Hip flexion      Hip extension      Hip abduction      Hip adduction      Hip internal rotation      Hip external rotation      Knee flexion      Knee extension      Ankle dorsiflexion      Ankle plantarflexion      Ankle inversion      Ankle eversion       (Blank rows = not tested)   LUMBAR SPECIAL TESTS:  Straight leg raise test: Negative, Slump test: Negative, and SI Compression/distraction test: Negative   FUNCTIONAL TESTS:  NT   GAIT: Distance walked: 200' Assistive device utilized: None Level of assistance: Complete Independence Comments: WNLs   TODAY'S TREATMENT:  [facility] Adult PT Treatment:                                                DATE: 2023/04/12 Therapeutic Exercise: Dead lifts 45# 3x10 SL dead lifts 25#  Lat pull bilat 55# 2x10 Single arm lat pull 23# 2x10 Plank x3 20"  Side plank x3 20" each Quad hydrants BluTB 2x8 Child's pose stretch Pigeon stretch Updated HEP Self Care: Use of cold pack 10-15 mins to L low back/SI jt following activity  [facility] Adult PT Treatment:                                                DATE: 03/13/23 Therapeutic Exercise: Developed, instructed in, and pt completed therex as noted in HEP    PATIENT EDUCATION:  Education details: Eval findings, POC, HEP  Person educated: Patient Education method: Explanation, Demonstration, Tactile cues, Verbal cues, and Handouts Education comprehension: verbalized understanding, returned demonstration, verbal cues required, and tactile cues required   HOME EXERCISE PROGRAM: Access Code: [email or web address] URL: [email or web address] Date: 03/14/2023 Prepared by: Joellyn Rued    Exercises - Supine Bridge  - 1-2 x daily - 7 x weekly - 2 sets - 10 reps - 10 hold - Single Leg Bridge  - 1-2 x daily - 7 x weekly - 2 sets - 10 reps - 5 hold - Supine Piriformis Stretch  - 2 x daily - 7 x weekly - 1 sets - 3 reps - 20 hold - Seated Hamstring Stretch  - 2 x daily - 7 x weekly - 1 sets - 3 reps   ASSESSMENT:   CLINICAL IMPRESSION: Pt returns to PT for his 1st PT session after the eval. Pt reports consistent completion of his HEP. Pt notes his presentation of L low back/SI jt pain has not changed. PT was completed today for posterior chain and hip strengthening and flexibility. Pt tolerated PT today without increase in pain. Pt returned proper demonstration of therex and pt's HEP was updated. Pt will continue to benefit from skilled PT to address impairments for improved function.    OBJECTIVE IMPAIRMENTS: decreased activity tolerance and decreased ROM.    ACTIVITY LIMITATIONS: Recreational pursuits   PARTICIPATION LIMITATIONS:  Recreational pursuits   PERSONAL FACTORS: Past/current experiences and Time since onset of injury/illness/exacerbation are also affecting patient's functional outcome.    REHAB POTENTIAL: Excellent   CLINICAL DECISION MAKING: Stable/uncomplicated   EVALUATION COMPLEXITY: Low     GOALS:     SHORT TERM GOALS= LTGS     LONG TERM GOALS: Target date: 05/03/23   Pt will be Ind in a final HEP to maintain achieved LOF  Baseline: initiated Goal status: INITIAL   2.  Increase L hip IR and L hamstring flexibility equal to the R LE Baseline: see flow sheets Goal status: INITIAL   3.  Pt will report a decrease in L LBP to 0-3 for improved ability to participate in recreational pursuits and QOL Baseline: 1-7/10 Goal status: INITIAL   PLAN:   PT FREQUENCY: 1x/week   PT DURATION: 6 weeks   PLANNED INTERVENTIONS: Therapeutic exercises, Therapeutic activity, Gait training, Patient/Family education, Self Care, Joint mobilization, Dry Needling,  Electrical stimulation, Spinal mobilization, Cryotherapy, Moist heat, Taping, Ionotophoresis 4mg /ml Dexamethasone, Manual therapy, and Re-evaluation.   PLAN FOR NEXT SESSION: Review FOTO; assess response to HEP; progress therex as indicated; use of modalities, manual therapy; and TPDN as indicated.   Daltin Crist MS, PT 04/10/23 2:58 PM  PHYSICAL THERAPY DISCHARGE SUMMARY  Visits from Start of Care: 2  Current functional level related to goals / functional outcomes: Unknown, not returning since the last visit   Remaining deficits: Unknown, not returning since the last visit   Education / Equipment: HEP   Patient agrees to discharge. Patient goals were  not met, not returning since the last visit . Patient is being discharged  due to not returning since the last visit.   Consuello Lassalle MS, PT 05/16/23 1:16 PM

## 2023-04-10 ENCOUNTER — Ambulatory Visit: Payer: 59

## 2023-04-10 DIAGNOSIS — M5459 Other low back pain: Secondary | ICD-10-CM

## 2023-04-17 ENCOUNTER — Ambulatory Visit: Payer: 59

## 2023-04-19 ENCOUNTER — Telehealth: Payer: Self-pay

## 2023-04-19 NOTE — Telephone Encounter (Signed)
LVM re: 2nd no show appt. Reminded pt of his upcoming appt on 04/24/23. Advised pt that with 2 no show appts, future appts may only be scheduled 1 at a time.

## 2023-04-24 ENCOUNTER — Ambulatory Visit: Payer: 59

## 2023-05-01 ENCOUNTER — Ambulatory Visit: Payer: 59

## 2023-07-18 DIAGNOSIS — N5082 Scrotal pain: Secondary | ICD-10-CM | POA: Diagnosis not present

## 2023-07-18 DIAGNOSIS — Z7251 High risk heterosexual behavior: Secondary | ICD-10-CM | POA: Diagnosis not present

## 2023-07-30 ENCOUNTER — Other Ambulatory Visit: Payer: Self-pay | Admitting: Sports Medicine

## 2023-10-01 DIAGNOSIS — L255 Unspecified contact dermatitis due to plants, except food: Secondary | ICD-10-CM | POA: Diagnosis not present

## 2023-11-23 DIAGNOSIS — M25531 Pain in right wrist: Secondary | ICD-10-CM | POA: Diagnosis not present

## 2023-11-23 DIAGNOSIS — S62024A Nondisplaced fracture of middle third of navicular [scaphoid] bone of right wrist, initial encounter for closed fracture: Secondary | ICD-10-CM | POA: Diagnosis not present

## 2023-11-29 DIAGNOSIS — M25531 Pain in right wrist: Secondary | ICD-10-CM | POA: Diagnosis not present

## 2023-12-02 ENCOUNTER — Encounter: Payer: Self-pay | Admitting: Orthopedic Surgery

## 2023-12-02 ENCOUNTER — Other Ambulatory Visit: Payer: Self-pay | Admitting: Orthopedic Surgery

## 2023-12-02 DIAGNOSIS — S62002A Unspecified fracture of navicular [scaphoid] bone of left wrist, initial encounter for closed fracture: Secondary | ICD-10-CM

## 2023-12-05 ENCOUNTER — Other Ambulatory Visit: Payer: Self-pay | Admitting: Orthopedic Surgery

## 2023-12-05 ENCOUNTER — Ambulatory Visit
Admission: RE | Admit: 2023-12-05 | Discharge: 2023-12-05 | Disposition: A | Discharge status: Home / Self Care | Payer: 59 | Source: Ambulatory Visit | Attending: Orthopedic Surgery | Admitting: Orthopedic Surgery

## 2023-12-05 DIAGNOSIS — S62002A Unspecified fracture of navicular [scaphoid] bone of left wrist, initial encounter for closed fracture: Secondary | ICD-10-CM

## 2023-12-05 DIAGNOSIS — M25531 Pain in right wrist: Secondary | ICD-10-CM | POA: Diagnosis not present

## 2024-03-12 DIAGNOSIS — M25531 Pain in right wrist: Secondary | ICD-10-CM | POA: Insufficient documentation

## 2024-05-22 ENCOUNTER — Encounter (HOSPITAL_COMMUNITY): Payer: Self-pay

## 2024-05-22 ENCOUNTER — Ambulatory Visit (HOSPITAL_COMMUNITY)
Admission: EM | Admit: 2024-05-22 | Discharge: 2024-05-22 | Disposition: A | Discharge status: Home / Self Care | Attending: Nurse Practitioner | Admitting: Nurse Practitioner

## 2024-05-22 DIAGNOSIS — R1031 Right lower quadrant pain: Secondary | ICD-10-CM

## 2024-05-22 LAB — POCT URINALYSIS DIP (MANUAL ENTRY)
Bilirubin, UA: NEGATIVE
Blood, UA: NEGATIVE
Glucose, UA: NEGATIVE mg/dL
Ketones, POC UA: NEGATIVE mg/dL
Leukocytes, UA: NEGATIVE
Nitrite, UA: NEGATIVE
Protein Ur, POC: NEGATIVE mg/dL
Spec Grav, UA: 1.015 (ref 1.010–1.025)
Urobilinogen, UA: 0.2 U/dL
pH, UA: 7 (ref 5.0–8.0)

## 2024-05-22 NOTE — ED Provider Notes (Signed)
 [facility]    CSN: [id number] Arrival date & time: 05/22/24  1249      History   Chief Complaint Chief Complaint  Patient presents with   Flank Pain    HPI Jack Nguyen is a 28 y.o. male.   Jack Nguyen is a 28 y.o. male that presents with right lower quadrant abdominal pain that started acutely last night. The pain is located above the hip bone and is described as sharp in nature. It stays in the same location and comes and goes.The patient reports that the pain to the right lower quadrant was worse when urinating, but denies dysuria or any other urinary related symptoms. Today, the pain seems to become more noticeable with certain body movements and when walking. The patient denies any nausea, vomiting, or changes in appetite, though they haven't eaten today due to waking up late. There is no history of similar pain episodes or abdominal surgeries. The patient denies fever, chills, body aches, genital pain, discharge, or issues with bowel movements. They have not attempted any remedies for the discomfort. The patient mentions a year-long history of intermittent weakened urine stream, though this is not currently relevant to the presenting complaint.   The following portions of the patient's history were reviewed and updated as appropriate: allergies, current medications, past family history, past medical history, past social history, past surgical history, and problem list.    History reviewed. No pertinent past medical history.  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 No pertinent surgical history.     Home Medications    Prior to Admission medications   Not on File    Family History History reviewed. No pertinent family history.  Social History Social History   Tobacco Use    Smoking status: Former    Current packs/day: 0.00    Types: Cigarettes    Quit date: 05/25/2016    Years since quitting: 7.9   Smokeless tobacco: Never  Vaping Use   Vaping status: Never Used  Substance Use Topics   Alcohol use: Yes    Comment: occ   Drug use: Yes    Frequency: 5.0 times per week    Types: Marijuana     Allergies   Patient has no known allergies.   Review of Systems Review of Systems  Constitutional:  Negative for appetite change, chills and fever.  Gastrointestinal:  Positive for abdominal pain (RLQ). Negative for nausea and vomiting.  Genitourinary:  Negative for dysuria, frequency, penile discharge, penile pain, penile swelling, scrotal swelling, testicular pain and urgency.  Musculoskeletal:  Negative for myalgias.  All other systems reviewed and are negative.    Physical Exam Triage Vital Signs ED Triage Vitals  Encounter Vitals Group     BP 05/22/24 1413 (!) 150/85     Systolic BP Percentile --      Diastolic BP Percentile --      Pulse Rate 05/22/24 1413 62     Resp 05/22/24 1413 18     Temp 05/22/24 1413 98 F (36.7 C)     Temp Source 05/22/24 1413 Oral     SpO2 05/22/24 1413 100 %     Weight --      Height --      Head Circumference --      Peak Flow --  Pain Score 05/22/24 1414 1     Pain Loc --      Pain Education --      Exclude from Growth Chart --    No data found.  Updated Vital Signs BP (!) 150/85 (BP Location: Right Arm)   Pulse 62   Temp 98 F (36.7 C) (Oral)   Resp 18   SpO2 100%   Visual Acuity Right Eye Distance:   Left Eye Distance:   Bilateral Distance:    Right Eye Near:   Left Eye Near:    Bilateral Near:     Physical Exam Vitals and nursing note reviewed.  Constitutional:      General: He is awake. He is not in acute distress.    Appearance: Normal appearance. He is well-developed, well-groomed and normal weight. He is not ill-appearing or toxic-appearing.  HENT:     Head: Normocephalic.      Mouth/Throat:     Mouth: Mucous membranes are moist.  Cardiovascular:     Rate and Rhythm: Normal rate and regular rhythm.     Heart sounds: Normal heart sounds.  Pulmonary:     Effort: Pulmonary effort is normal.     Breath sounds: Normal breath sounds and air entry.  Abdominal:     General: Bowel sounds are normal. There is no distension.     Palpations: Abdomen is soft.     Tenderness: There is no abdominal tenderness. There is no right CVA tenderness, left CVA tenderness, guarding or rebound.  Genitourinary:    Comments: Deferred  Musculoskeletal:     Cervical back: Full passive range of motion without pain, normal range of motion and neck supple.     Right lower leg: No edema.     Left lower leg: No edema.  Skin:    General: Skin is warm and dry.  Neurological:     Mental Status: He is alert.  Psychiatric:        Behavior: Behavior is cooperative.      UC Treatments / Results  Labs (all labs ordered are listed, but only abnormal results are displayed) Labs Reviewed  POCT URINALYSIS DIP (MANUAL ENTRY)    EKG   Radiology No results found.  Procedures Procedures (including critical care time)  Medications Ordered in UC Medications - No data to display  Initial Impression / Assessment and Plan / UC Course  I have reviewed the triage vital signs and the nursing notes.  Pertinent labs & imaging results that were available during my care of the patient were reviewed by me and considered in my medical decision making (see chart for details).     Patient presents with sharp, intermittent right lower quadrant abdominal pain that began last night and worsens with movement. Initially the pain occurred whenever he urinated although he never experienced any dysuria or any other urinary symptoms. No fever, chills, nausea, vomiting, or changes in bowel habits reported. Urinalysis is negative for infection or hematuria. Physical exam shows no tenderness on palpation.  Differential includes muscular strain, early appendicitis, and referred pain. Acute appendicitis and testicular torsion are considered unlikely given normal exam and urinalysis. Patient advised to monitor symptoms closely at home, increase fluid intake, and seek emergency care if pain worsens or additional symptoms develop. Follow-up with PCP recommended if pain persists beyond a few days.   Final Clinical Impressions(s) / UC Diagnoses   Final diagnoses:  Right lower quadrant abdominal pain     Discharge Instructions  You were evaluated today for sharp, intermittent pain in your right lower abdomen that started last night and becomes worse with movement. You mentioned that the pain was initially noticeable during urination, but you have not experienced any burning, urgency, or other urinary symptoms. You also have not had fever, chills, nausea, vomiting, or changes in your bowel habits. Your physical exam was normal, and your urine test did not show any signs of infection or blood.  At this time, your symptoms do not appear to be caused by a serious condition. Possible causes include a mild muscle strain, early signs of appendicitis, or pain referred from another area. Based on your normal exam and urinalysis, serious conditions like appendicitis or testicular torsion are considered unlikely.  You should monitor your symptoms closely at home over the next few days. Be sure to drink plenty of fluids so that your urine stays clear or light yellow. If your pain becomes more severe, or if you develop new symptoms such as fever, vomiting, chills, pain in your testicles, groin, or flank, difficulty urinating, or changes in your bowel movements, go to the emergency room right away. If your pain continues for more than a few days without improvement, follow up with your primary care provider for further evaluation.    ED Prescriptions   None    PDMP not reviewed this encounter.   Maryruth Sol, [place] 05/22/24 1524

## 2024-05-22 NOTE — ED Triage Notes (Signed)
 Pt c/o rt flank pain with difficulty urinating since last night. Denies taken any meds.

## 2024-05-22 NOTE — Discharge Instructions (Addendum)
 You were evaluated today for sharp, intermittent pain in your right lower abdomen that started last night and becomes worse with movement. You mentioned that the pain was initially noticeable during urination, but you have not experienced any burning, urgency, or other urinary symptoms. You also have not had fever, chills, nausea, vomiting, or changes in your bowel habits. Your physical exam was normal, and your urine test did not show any signs of infection or blood.  At this time, your symptoms do not appear to be caused by a serious condition. Possible causes include a mild muscle strain, early signs of appendicitis, or pain referred from another area. Based on your normal exam and urinalysis, serious conditions like appendicitis or testicular torsion are considered unlikely.  You should monitor your symptoms closely at home over the next few days. Be sure to drink plenty of fluids so that your urine stays clear or light yellow. If your pain becomes more severe, or if you develop new symptoms such as fever, vomiting, chills, pain in your testicles, groin, or flank, difficulty urinating, or changes in your bowel movements, go to the emergency room right away. If your pain continues for more than a few days without improvement, follow up with your primary care provider for further evaluation.

## 2024-08-03 ENCOUNTER — Ambulatory Visit
Admission: RE | Admit: 2024-08-03 | Discharge: 2024-08-03 | Disposition: A | Discharge status: Home / Self Care | Source: Ambulatory Visit | Attending: Emergency Medicine | Admitting: Emergency Medicine

## 2024-08-03 ENCOUNTER — Ambulatory Visit: Payer: Self-pay | Admitting: Emergency Medicine

## 2024-08-03 VITALS — BP 109/71 | HR 66 | Temp 97.6°F | Resp 18 | Ht 72.0 in | Wt 165.0 lb

## 2024-08-03 DIAGNOSIS — J029 Acute pharyngitis, unspecified: Secondary | ICD-10-CM

## 2024-08-03 DIAGNOSIS — F988 Other specified behavioral and emotional disorders with onset usually occurring in childhood and adolescence: Secondary | ICD-10-CM | POA: Insufficient documentation

## 2024-08-03 DIAGNOSIS — K589 Irritable bowel syndrome without diarrhea: Secondary | ICD-10-CM | POA: Insufficient documentation

## 2024-08-03 DIAGNOSIS — K9049 Malabsorption due to intolerance, not elsewhere classified: Secondary | ICD-10-CM | POA: Insufficient documentation

## 2024-08-03 LAB — POCT MONO SCREEN (KUC): Mono, POC: NEGATIVE

## 2024-08-03 LAB — POCT RAPID STREP A (OFFICE): Rapid Strep A Screen: NEGATIVE

## 2024-08-03 MED ORDER — IBUPROFEN 600 MG PO TABS: 600.0000 mg | ORAL_TABLET | Freq: Three times a day (TID) | ORAL | 0 refills | Status: AC | PRN

## 2024-08-03 MED ORDER — IPRATROPIUM BROMIDE 0.06 % NA SOLN: 2.0000 | Freq: Four times a day (QID) | NASAL | 0 refills | Status: AC

## 2024-08-03 NOTE — Discharge Instructions (Addendum)
 Your rapid strep and mono testing were both negative today.  I have sent it off for culture to ensure you do not have an infection that would require antibiotic treatment.  1 gram of Tylenol and 600 mg ibuprofen  together 3-4 times a day as needed for pain.  Make sure you drink plenty of extra fluids.  Some people find salt water gargles and  Traditional Medicinal's Throat Coat tea helpful. Take 5 mL of liquid Benadryl and 5 mL of Maalox/Mylanta. Mix it together, and then hold it in your mouth for as long as you can and then swallow. You may do this 4 times a day.  Honey and lemon dissolved in hot water can also be soothing.  Atrovent  nasal spray for the nasal congestion, postnasal drip, Mucinex D.  Saline nasal irrigation with a NeilMed sinus rinse and distilled water as often as you want.  Will help with the nasal congestion and postnasal drip.  Go to www.goodrx.com  or www.costplusdrugs.com to look up your medications. This will give you a list of where you can find your prescriptions at the most affordable prices. Or ask the pharmacist what the cash price is, or if they have any other discount programs available to help make your medication more affordable. This can be less expensive than what you would pay with insurance.

## 2024-08-03 NOTE — ED Triage Notes (Signed)
 A sore throat started Wednesday night, then Fever Thursday/Friday (none since) but the sore throat continues, I tested for COVID19 and it is Negative. No new/unexplained rash. I do see spots in my throat.

## 2024-08-03 NOTE — ED Provider Notes (Signed)
 HPI  SUBJECTIVE:  Patient reports sore throat starting 6 days ago. Sx worse with swallowing.  Sx better with Tylenol and salt water rinses.  + Felt feverish but does not have a thermometer at home No cervical lymphadenopathy  no neck stiffness  + Nonproductive cough, but is able to sleep at night without waking up coughing No wheezing + nasal congestion, rhinorrhea, postnasal drip + Myalgias + Headache No Rash  No shortness of breath  No nausea, vomiting No diarrhea No abdominal pain     No known recent Strep, mono, flu, COVID exposure, but got sick while he was in [place]  recently No reflux sxs No Allergy sxs  No Breathing difficulty, voice changes, sensation of throat swelling shut No Drooling No Trismus + Reports difficulty sleeping at night because of the sore throat No abx in past month.  No antipyretic in past 6 hrs  Past medical history of IBS PCP [facility]   History reviewed. No pertinent past medical history.  History reviewed. No pertinent surgical history.  History reviewed. No pertinent family history.  Social History   Tobacco Use   Smoking status: Former    Current packs/day: 0.00    Types: Cigarettes    Quit date: 05/25/2016    Years since quitting: 8.2   Smokeless tobacco: Never   Tobacco comments:    Tried in past.   Vaping Use   Vaping status: Never Used  Substance Use Topics   Alcohol use: Yes    Comment: occassionally.   Drug use: Yes    Frequency: 5.0 times per week    Types: Marijuana    No current facility-administered medications for this encounter.  Current Outpatient Medications:    ibuprofen  (ADVIL ) 600 MG tablet, Take 1 tablet (600 mg total) by mouth every 8 (eight) hours as needed., Disp: 30 tablet, Rfl: 0   ipratropium (ATROVENT ) 0.06 % nasal spray, Place 2 sprays into both nostrils 4 (four) times daily., Disp: 15 mL, Rfl: 0  No Known Allergies   ROS  As noted in HPI.   Physical Exam  BP 109/71  (BP Location: Right Arm)   Pulse 66   Temp 97.6 F (36.4 C) (Oral)   Resp 18   Ht 6' (1.829 m)   Wt 74.8 kg   SpO2 97%   BMI 22.38 kg/m   Constitutional: Well developed, well nourished, no acute distress Eyes:  EOMI, conjunctiva normal bilaterally HENT: Normocephalic, atraumatic,mucus membranes moist.  Positive nasal congestion.  Erythematous, swollen turbinates.  Slightly erythematous oropharynx.  Normal-sized tonsils without exudates.  Uvula midline.  Positive cobblestoning, extensive postnasal drip.  No neck stiffness.  Normal voice.  No drooling, trismus. Respiratory: Normal inspiratory effort, lungs clear bilaterally Cardiovascular: Normal rate, no murmurs, rubs, gallops GI: nondistended, nontender. No appreciable splenomegaly skin: No rash, skin intact Lymph: Positive anterior cervical LN.  No posterior cervical lymphadenopathy. Musculoskeletal: no deformities Neurologic: Alert & oriented x 3, no focal neuro deficits Psychiatric: Speech and behavior appropriate.   ED Course   Medications - No data to display  Orders Placed This Encounter  Procedures   Culture, group A strep    Send off ONLY if rapid strep is NEGATIVE. thanks    Standing Status:   Standing    Number of Occurrences:   1   POCT rapid strep A    Standing Status:   Standing    Number of Occurrences:   1   POCT mono screen    Standing  Status:   Standing    Number of Occurrences:   1    No results found for this or any previous visit (from the past 24 hours).  No results found.  ED Clinical Impression  1. Sore throat      ED Assessment/Plan    Will check rapid strep.  If negative, will send off throat culture.  Will also check a mono.  Will contact patient [phone] or through MyChart if any of these are positive and we need to change management.  With holding antibiotic treatment for now.  Supportive treatment with Atrovent , Benadryl/Maalox mixture, saline nasal rogation, Mucinex D.   Follow-up with PCP if not getting any better.  He is amenable to this plan.  Rapid strep, mono negative.  Throat culture sent.  Discussed labs,  MDM, plan and followup with patient. Discussed sn/sx that should prompt return to the ED. patient agrees with plan.   Meds ordered this encounter  Medications   ipratropium (ATROVENT ) 0.06 % nasal spray    Sig: Place 2 sprays into both nostrils 4 (four) times daily.    Dispense:  15 mL    Refill:  0   ibuprofen  (ADVIL ) 600 MG tablet    Sig: Take 1 tablet (600 mg total) by mouth every 8 (eight) hours as needed.    Dispense:  30 tablet    Refill:  0     *This clinic note was created using Scientist, clinical (histocompatibility and immunogenetics). Therefore, there may be occasional mistakes despite careful proofreading.     Van Knee, MD 08/06/24 1454

## 2024-08-05 LAB — CULTURE, GROUP A STREP (THRC)
